# Patient Record
Sex: Male | Born: 1980 | Race: Black or African American | Hispanic: No | Marital: Single | Smoking: Current some day smoker
Health system: Southern US, Community
[De-identification: ages and names within clinical notes are randomized; demographics above are authoritative.]

---

## 2002-12-03 ENCOUNTER — Emergency Department (HOSPITAL_COMMUNITY): Admission: EM | Admit: 2002-12-03 | Discharge: 2002-12-03 | Payer: Self-pay | Admitting: Emergency Medicine

## 2005-05-07 ENCOUNTER — Emergency Department (HOSPITAL_COMMUNITY): Admission: EM | Admit: 2005-05-07 | Discharge: 2005-05-08 | Payer: Self-pay | Admitting: Emergency Medicine

## 2005-10-15 ENCOUNTER — Emergency Department (HOSPITAL_COMMUNITY): Admission: EM | Admit: 2005-10-15 | Discharge: 2005-10-15 | Payer: Self-pay | Admitting: Emergency Medicine

## 2008-01-10 ENCOUNTER — Ambulatory Visit: Payer: Self-pay | Admitting: Family Medicine

## 2008-03-14 ENCOUNTER — Ambulatory Visit: Payer: Self-pay | Admitting: Family Medicine

## 2008-08-03 ENCOUNTER — Ambulatory Visit: Payer: Self-pay | Admitting: Family Medicine

## 2009-05-11 ENCOUNTER — Ambulatory Visit: Payer: Self-pay | Admitting: Family Medicine

## 2009-05-11 ENCOUNTER — Encounter: Admission: RE | Admit: 2009-05-11 | Discharge: 2009-05-11 | Payer: Self-pay | Admitting: Family Medicine

## 2009-05-18 ENCOUNTER — Ambulatory Visit: Payer: Self-pay | Admitting: Family Medicine

## 2010-02-17 ENCOUNTER — Emergency Department (HOSPITAL_COMMUNITY)
Admission: EM | Admit: 2010-02-17 | Discharge: 2010-02-17 | Payer: Self-pay | Source: Home / Self Care | Admitting: Emergency Medicine

## 2010-10-04 ENCOUNTER — Encounter: Payer: Self-pay | Admitting: Family Medicine

## 2010-11-12 IMAGING — CR DG WRIST COMPLETE 3+V*R*
1 series · 1 of 1 positions shown · non-contrast
Comparison: None

CLINICAL DATA: Wrist pain status post fall approximately 2 weeks
ago.

RIGHT WRIST - COMPLETE 3+ VIEW

[view not recorded]
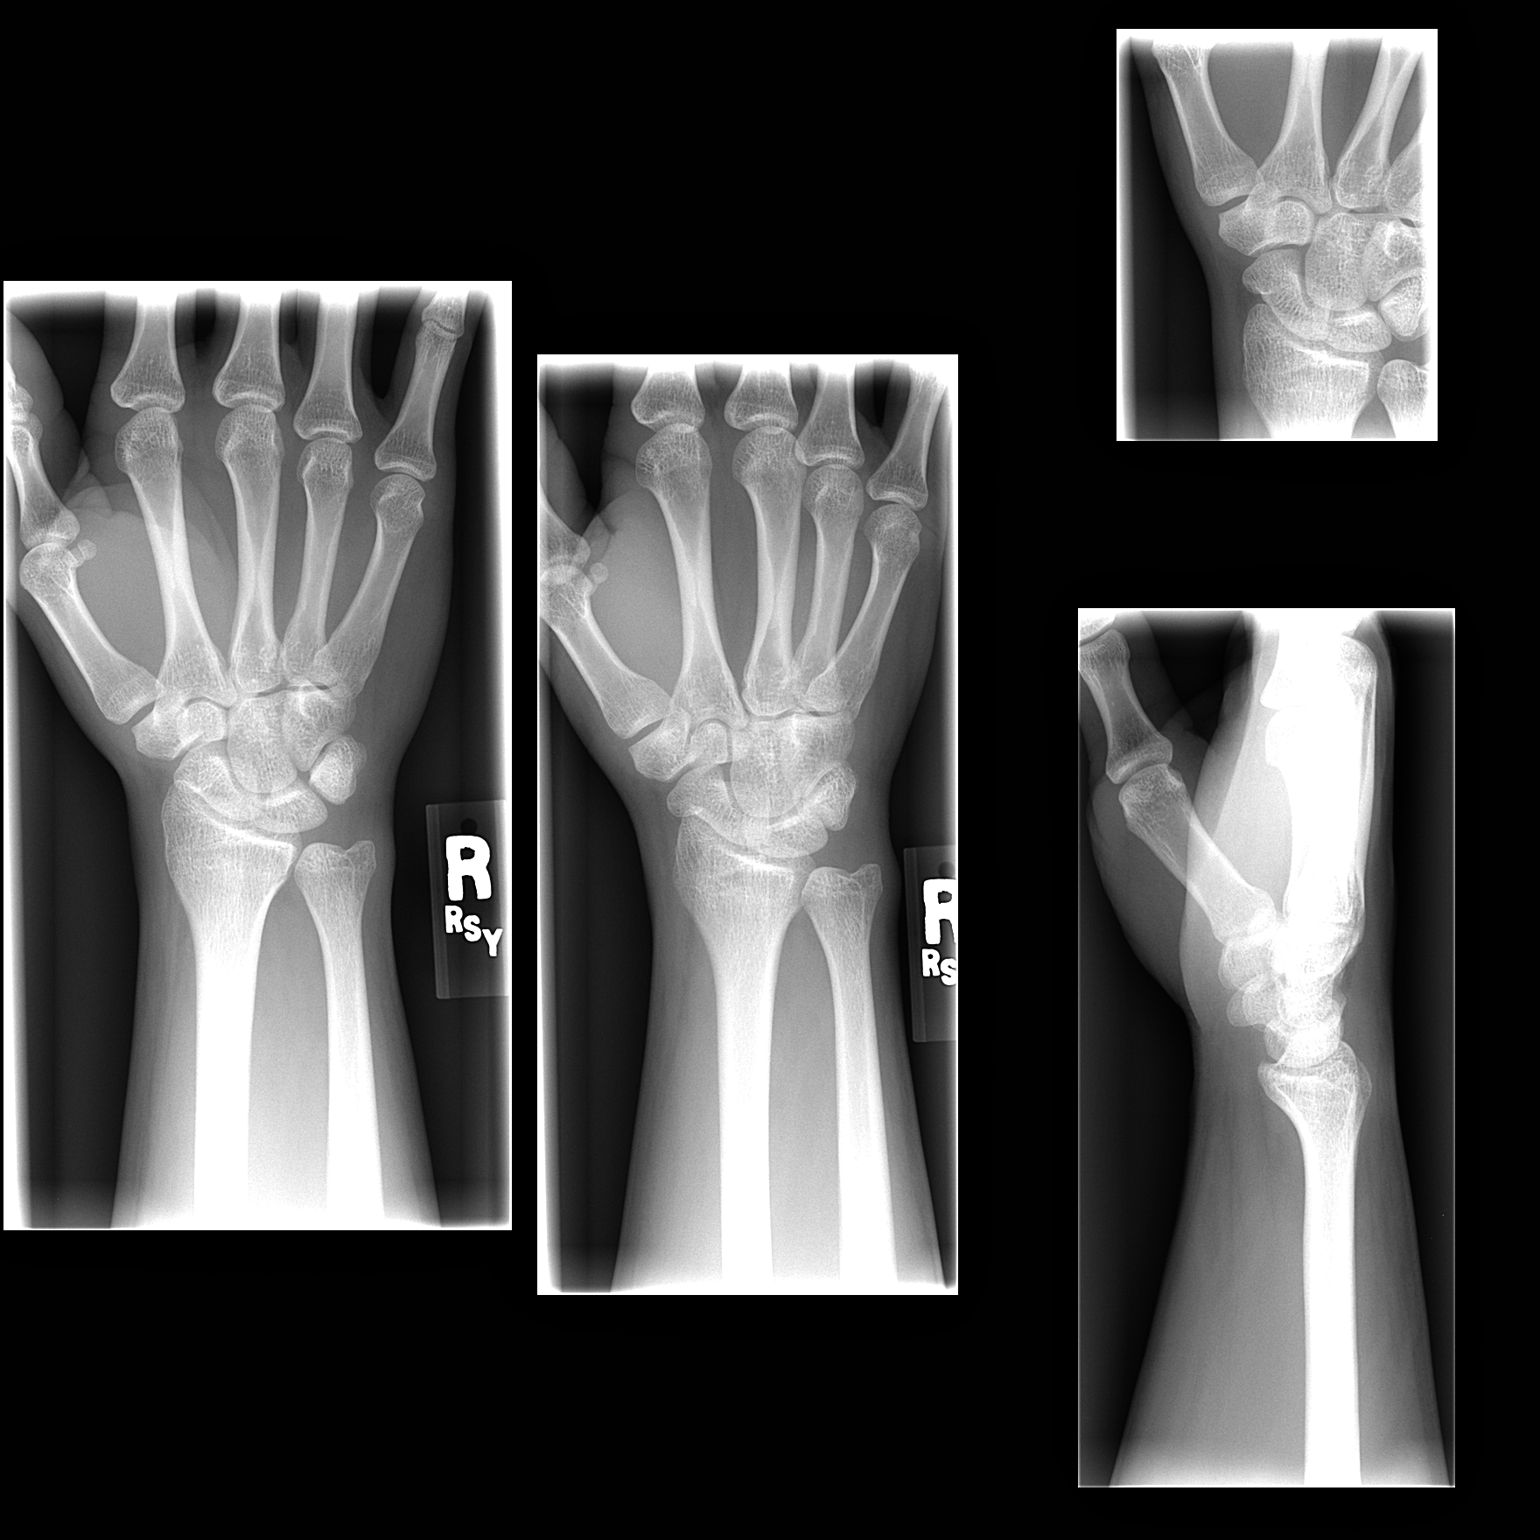

[1 of 1 positions shown; findings below may reference images not displayed]

FINDINGS: Mineralization and alignment are normal.  There is no
evidence of acute fracture or dislocation.  The joint spaces appear
preserved.
IMPRESSION: No acute osseous findings.
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
## 2011-12-30 ENCOUNTER — Other Ambulatory Visit: Payer: Self-pay

## 2011-12-30 ENCOUNTER — Encounter: Payer: Self-pay | Admitting: Family Medicine

## 2011-12-30 ENCOUNTER — Ambulatory Visit (INDEPENDENT_AMBULATORY_CARE_PROVIDER_SITE_OTHER): Payer: BC Managed Care – PPO | Admitting: Family Medicine

## 2011-12-30 VITALS — BP 140/90 | HR 78 | Wt 136.0 lb

## 2011-12-30 DIAGNOSIS — S6000XA Contusion of unspecified finger without damage to nail, initial encounter: Secondary | ICD-10-CM

## 2011-12-30 DIAGNOSIS — S60111A Contusion of right thumb with damage to nail, initial encounter: Secondary | ICD-10-CM

## 2011-12-30 DIAGNOSIS — M79644 Pain in right finger(s): Secondary | ICD-10-CM

## 2011-12-30 DIAGNOSIS — M79609 Pain in unspecified limb: Secondary | ICD-10-CM
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
## 2012-04-25 ENCOUNTER — Emergency Department (HOSPITAL_COMMUNITY)
Admission: EM | Admit: 2012-04-25 | Discharge: 2012-04-25 | Disposition: A | Discharge status: Home / Self Care | Payer: BC Managed Care – PPO | Attending: Emergency Medicine | Admitting: Emergency Medicine

## 2012-04-25 ENCOUNTER — Encounter (HOSPITAL_COMMUNITY): Payer: Self-pay

## 2012-04-25 ENCOUNTER — Emergency Department (HOSPITAL_COMMUNITY): Payer: BC Managed Care – PPO

## 2012-04-25 DIAGNOSIS — S4980XA Other specified injuries of shoulder and upper arm, unspecified arm, initial encounter: Secondary | ICD-10-CM | POA: Insufficient documentation

## 2012-04-25 DIAGNOSIS — IMO0002 Reserved for concepts with insufficient information to code with codable children: Secondary | ICD-10-CM | POA: Insufficient documentation

## 2012-04-25 DIAGNOSIS — S060XAA Concussion with loss of consciousness status unknown, initial encounter: Secondary | ICD-10-CM | POA: Insufficient documentation

## 2012-04-25 DIAGNOSIS — Y92410 Unspecified street and highway as the place of occurrence of the external cause: Secondary | ICD-10-CM | POA: Insufficient documentation

## 2012-04-25 DIAGNOSIS — S46909A Unspecified injury of unspecified muscle, fascia and tendon at shoulder and upper arm level, unspecified arm, initial encounter: Secondary | ICD-10-CM | POA: Insufficient documentation

## 2012-04-25 DIAGNOSIS — S4990XA Unspecified injury of shoulder and upper arm, unspecified arm, initial encounter: Secondary | ICD-10-CM

## 2012-04-25 DIAGNOSIS — Y9389 Activity, other specified: Secondary | ICD-10-CM | POA: Insufficient documentation

## 2012-04-25 DIAGNOSIS — S060X9A Concussion with loss of consciousness of unspecified duration, initial encounter: Secondary | ICD-10-CM

## 2012-04-25 DIAGNOSIS — F17200 Nicotine dependence, unspecified, uncomplicated: Secondary | ICD-10-CM | POA: Insufficient documentation

## 2012-04-25 DIAGNOSIS — S0993XA Unspecified injury of face, initial encounter: Secondary | ICD-10-CM | POA: Insufficient documentation

## 2012-04-25 MED ORDER — IBUPROFEN 800 MG PO TABS: 800.0000 mg | ORAL_TABLET | Freq: Three times a day (TID) | ORAL | Status: DC

## 2012-04-25 MED ORDER — DIAZEPAM 5 MG PO TABS
5.0000 mg | ORAL_TABLET | Freq: Once | ORAL | Status: AC
  Administered 2012-04-25: 5 mg via ORAL
  Filled 2012-04-25: qty 1

## 2012-04-25 MED ORDER — IBUPROFEN 800 MG PO TABS
800.0000 mg | ORAL_TABLET | Freq: Once | ORAL | Status: AC
  Administered 2012-04-25: 800 mg via ORAL
  Filled 2012-04-25: qty 1

## 2012-04-25 MED ORDER — METOCLOPRAMIDE HCL 10 MG PO TABS
10.0000 mg | ORAL_TABLET | Freq: Once | ORAL | Status: AC
  Administered 2012-04-25: 10 mg via ORAL
  Filled 2012-04-25: qty 1
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
## 2012-04-28 ENCOUNTER — Ambulatory Visit (INDEPENDENT_AMBULATORY_CARE_PROVIDER_SITE_OTHER): Payer: BC Managed Care – PPO | Admitting: Medical

## 2012-04-28 ENCOUNTER — Encounter: Payer: Self-pay | Admitting: Medical

## 2012-04-28 VITALS — BP 130/88 | HR 78 | Temp 98.0°F | Resp 16 | Wt 137.0 lb

## 2012-04-28 DIAGNOSIS — M549 Dorsalgia, unspecified: Secondary | ICD-10-CM

## 2012-04-28 DIAGNOSIS — M25511 Pain in right shoulder: Secondary | ICD-10-CM

## 2012-04-28 DIAGNOSIS — M79601 Pain in right arm: Secondary | ICD-10-CM

## 2012-04-28 DIAGNOSIS — S060X9A Concussion with loss of consciousness of unspecified duration, initial encounter: Secondary | ICD-10-CM

## 2012-04-28 DIAGNOSIS — R209 Unspecified disturbances of skin sensation: Secondary | ICD-10-CM

## 2012-04-28 DIAGNOSIS — M542 Cervicalgia: Secondary | ICD-10-CM

## 2012-04-28 DIAGNOSIS — M25519 Pain in unspecified shoulder: Secondary | ICD-10-CM

## 2012-04-28 DIAGNOSIS — M79609 Pain in unspecified limb: Secondary | ICD-10-CM

## 2012-04-28 MED ORDER — CYCLOBENZAPRINE HCL 10 MG PO TABS: ORAL_TABLET | ORAL | Status: DC

## 2012-04-28 NOTE — Patient Instructions (Signed)
Your current symptoms suggest generalized back , right shoulder and neck pain/strain.  The concussion symptoms seem to have improved significantly.  Use heat to the neck and back.  Continue Ibuprofen 800mg  3 times daily for at least the next 5 days or so.  I am prescribing a muscle relaxer today called flexeril.  Use can use 1/2 - 1 tablet either at bedtime or up to twice daily as needed.  Begin chiropractic care with [facility] today as planned.    If not significantly improved in 1 week, then return.
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
## 2012-05-21 ENCOUNTER — Ambulatory Visit: Payer: BC Managed Care – PPO | Admitting: Medical

## 2012-06-08 ENCOUNTER — Ambulatory Visit (INDEPENDENT_AMBULATORY_CARE_PROVIDER_SITE_OTHER): Payer: BC Managed Care – PPO | Admitting: Medical

## 2012-06-08 ENCOUNTER — Encounter: Payer: Self-pay | Admitting: Medical

## 2012-06-08 VITALS — BP 128/82 | HR 70 | Resp 14 | Ht 63.0 in | Wt 140.0 lb

## 2012-06-08 DIAGNOSIS — R29898 Other symptoms and signs involving the musculoskeletal system: Secondary | ICD-10-CM

## 2012-06-08 DIAGNOSIS — M542 Cervicalgia: Secondary | ICD-10-CM

## 2012-06-08 DIAGNOSIS — M25611 Stiffness of right shoulder, not elsewhere classified: Secondary | ICD-10-CM

## 2012-06-08 DIAGNOSIS — M549 Dorsalgia, unspecified: Secondary | ICD-10-CM

## 2012-06-08 DIAGNOSIS — M62830 Muscle spasm of back: Secondary | ICD-10-CM

## 2012-06-08 DIAGNOSIS — M5380 Other specified dorsopathies, site unspecified: Secondary | ICD-10-CM

## 2012-06-08 DIAGNOSIS — R209 Unspecified disturbances of skin sensation: Secondary | ICD-10-CM

## 2012-06-08 DIAGNOSIS — M25519 Pain in unspecified shoulder: Secondary | ICD-10-CM

## 2012-06-08 DIAGNOSIS — R202 Paresthesia of skin: Secondary | ICD-10-CM

## 2012-06-08 MED ORDER — CYCLOBENZAPRINE HCL 10 MG PO TABS: ORAL_TABLET | ORAL | Status: AC

## 2012-06-08 MED ORDER — IBUPROFEN 800 MG PO TABS: 800.0000 mg | ORAL_TABLET | Freq: Three times a day (TID) | ORAL | Status: DC | PRN
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
## 2012-06-23 ENCOUNTER — Telehealth: Payer: Self-pay | Admitting: Internal Medicine

## 2012-06-23 NOTE — Telephone Encounter (Signed)
Medical records were faxed to Mackey Birchwood @ [facility] [place] [phone]

## 2012-09-29 DIAGNOSIS — Z0289 Encounter for other administrative examinations: Secondary | ICD-10-CM

## 2013-01-15 ENCOUNTER — Telehealth: Payer: Self-pay | Admitting: Internal Medicine

## 2013-01-15 NOTE — Telephone Encounter (Signed)
There was no records found that [employer]key Birchwood attorney at law was requesting for. Faxed back and let them know that.

## 2013-03-23 ENCOUNTER — Telehealth: Payer: Self-pay | Admitting: Internal Medicine

## 2013-03-23 NOTE — Telephone Encounter (Signed)
Faxed over medical records to Mackey Birchwood attorney at law @ [phone] from date 04/25/12 to present
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
## 2014-01-09 ENCOUNTER — Ambulatory Visit (INDEPENDENT_AMBULATORY_CARE_PROVIDER_SITE_OTHER): Payer: BC Managed Care – PPO | Admitting: Medical

## 2014-01-09 ENCOUNTER — Encounter: Payer: Self-pay | Admitting: Medical

## 2014-01-09 VITALS — BP 120/80 | HR 92 | Temp 97.9°F | Resp 16 | Wt 143.0 lb

## 2014-01-09 DIAGNOSIS — M5489 Other dorsalgia: Secondary | ICD-10-CM

## 2014-01-09 DIAGNOSIS — M549 Dorsalgia, unspecified: Secondary | ICD-10-CM

## 2014-01-09 DIAGNOSIS — M533 Sacrococcygeal disorders, not elsewhere classified: Secondary | ICD-10-CM

## 2014-01-09 DIAGNOSIS — M542 Cervicalgia: Secondary | ICD-10-CM

## 2014-01-09 MED ORDER — IBUPROFEN 800 MG PO TABS: 800.0000 mg | ORAL_TABLET | Freq: Three times a day (TID) | ORAL | Status: AC | PRN
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
## 2014-10-09 DIAGNOSIS — Z0279 Encounter for issue of other medical certificate: Secondary | ICD-10-CM

## 2017-07-31 ENCOUNTER — Encounter: Payer: Self-pay | Admitting: Family Medicine

## 2018-03-03 ENCOUNTER — Emergency Department (HOSPITAL_COMMUNITY): Payer: BLUE CROSS/BLUE SHIELD

## 2018-03-03 ENCOUNTER — Emergency Department (HOSPITAL_COMMUNITY)
Admission: EM | Admit: 2018-03-03 | Discharge: 2018-03-03 | Disposition: A | Discharge status: Home / Self Care | Payer: BLUE CROSS/BLUE SHIELD | Attending: Emergency Medicine | Admitting: Emergency Medicine

## 2018-03-03 ENCOUNTER — Encounter (HOSPITAL_COMMUNITY): Payer: Self-pay

## 2018-03-03 DIAGNOSIS — Y9389 Activity, other specified: Secondary | ICD-10-CM | POA: Diagnosis not present

## 2018-03-03 DIAGNOSIS — F17210 Nicotine dependence, cigarettes, uncomplicated: Secondary | ICD-10-CM | POA: Insufficient documentation

## 2018-03-03 DIAGNOSIS — M545 Low back pain, unspecified: Secondary | ICD-10-CM

## 2018-03-03 DIAGNOSIS — Y92410 Unspecified street and highway as the place of occurrence of the external cause: Secondary | ICD-10-CM | POA: Diagnosis not present

## 2018-03-03 DIAGNOSIS — Y999 Unspecified external cause status: Secondary | ICD-10-CM | POA: Insufficient documentation

## 2018-03-03 MED ORDER — METHOCARBAMOL 500 MG PO TABS: 500.0000 mg | ORAL_TABLET | Freq: Two times a day (BID) | ORAL | 0 refills | Status: AC

## 2018-03-03 MED ORDER — IBUPROFEN 800 MG PO TABS
800.0000 mg | ORAL_TABLET | Freq: Once | ORAL | Status: AC
  Administered 2018-03-03: 800 mg via ORAL
  Filled 2018-03-03: qty 1
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
## 2018-03-03 NOTE — Discharge Instructions (Signed)
Take Robaxin as needed for muscle spasms.  Ice affected area. Return to the ED immediately for any worsening symptoms or concerns, such as difficulty urinating, numbness, tingling, difficulty walking or any concerns at all.
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
## 2022-04-16 ENCOUNTER — Encounter: Payer: Self-pay | Admitting: Family Medicine

## 2022-04-16 ENCOUNTER — Ambulatory Visit (INDEPENDENT_AMBULATORY_CARE_PROVIDER_SITE_OTHER): Payer: BC Managed Care – PPO | Admitting: Family Medicine

## 2022-04-16 VITALS — BP 158/90 | HR 110 | Temp 97.5°F | Ht 63.0 in | Wt 151.0 lb

## 2022-04-16 DIAGNOSIS — H1033 Unspecified acute conjunctivitis, bilateral: Secondary | ICD-10-CM

## 2022-04-16 MED ORDER — CIPROFLOXACIN HCL 0.3 % OP SOLN: OPHTHALMIC | 0 refills | Status: AC

## 2022-04-16 NOTE — Assessment & Plan Note (Signed)
Symptoms consistent with bacterial conjunctivitis, pressure in setting of contact wearing.  Overall pupillary function is normal, nonpainful, low concern for glaucoma at this time Advised to avoid wearing contacts till resolution of symptoms Trial ciprofloxacin drops Return precautions discussed Low threshold to refer to ophthalmology

## 2022-04-16 NOTE — Patient Instructions (Signed)
Use ciprofloxacin drops, go to ED if severe pain or blurry vision

## 2022-04-16 NOTE — Progress Notes (Signed)
Assessment/Plan:   Problem List Items Addressed This Visit       Other   Acute bacterial conjunctivitis of both eyes - Primary    Symptoms consistent with bacterial conjunctivitis, pressure in setting of contact wearing.  Overall pupillary function is normal, nonpainful, low concern for glaucoma at this time Advised to avoid wearing contacts till resolution of symptoms Trial ciprofloxacin drops Return precautions discussed Low threshold to refer to ophthalmology      Relevant Medications   ciprofloxacin (CILOXAN) 0.3 % ophthalmic solution       Subjective:  HPI:  Alan Wright is a 41 y.o. male who has Acute bacterial conjunctivitis of both eyes on their problem list..   He  has no past medical history on file.Marland Kitchen   He presents with chief complaint of Establish Care (Eye redness , watery , crust and sensitivity x 3 days) .    Conjunctivitis.  Patient presents with 3 days of red eyes.  Patient last wears contacts on Sunday the following day he developed red eyes.  They are not painful but to feel irritated and gritty.  He also reports having significant purulent discharge and crusting.  Patient denies any vision loss or change.  He has no headache, otalgia fevers, runny nose, cough, chest pain, shortness of breath.  He has not had any contact with anyone else  History reviewed. No pertinent surgical history.  Outpatient Medications Prior to Visit  Medication Sig Dispense Refill   cyclobenzaprine (FLEXERIL) 10 MG tablet QHS spasm (Patient not taking: Reported on 04/16/2022) 30 tablet 0   ibuprofen (ADVIL,MOTRIN) 800 MG tablet Take 1 tablet (800 mg total) by mouth every 8 (eight) hours as needed. (Patient not taking: Reported on 04/16/2022) 30 tablet 0   No facility-administered medications prior to visit.    Family History  Problem Relation Age of Onset   Diabetes Father    Hypertension Father    Heart disease Father     Social History   Socioeconomic  History   Marital status: Single    Spouse name: Not on file   Number of children: Not on file   Years of education: Not on file   Highest education level: Not on file  Occupational History   Not on file  Tobacco Use   Smoking status: Some Days    Packs/day: 0.25    Types: Cigarettes    Passive exposure: Never   Smokeless tobacco: Never  Vaping Use   Vaping Use: Never used  Substance and Sexual Activity   Alcohol use: Yes   Drug use: No   Sexual activity: Not on file  Other Topics Concern   Not on file  Social History Narrative   Not on file   Social Determinants of Health   Financial Resource Strain: Not on file  Food Insecurity: Not on file  Transportation Needs: Not on file  Physical Activity: Not on file  Stress: Not on file  Social Connections: Not on file  Intimate Partner Violence: Not on file  Objective:  Physical Exam: BP (!) 158/90 (BP Location: Left Arm, Patient Position: Sitting, Cuff Size: Large)   Pulse (!) 110   Temp (!) 97.5 F (36.4 C) (Temporal)   Ht 5\' 3"  (1.6 m)   Wt 151 lb (68.5 kg)   SpO2 97%   BMI 26.75 kg/m    General: No acute distress. Awake and conversant.  Eyes: Bilateral injected sclera, no evidence of conjunctival, corneal, eyelid injury, EOMI, PERRLA ENT: Hearing grossly intact. No nasal discharge.  Neck: Neck is supple. No masses or thyromegaly.  Respiratory: Respirations are non-labored.  CTA B Skin: Warm. No rashes or ulcers.  Psych: Alert and oriented. Cooperative, Appropriate mood and affect, Normal judgment.  CV: No cyanosis or JVD, RRR, no MRG ABD: Nontender nondistended MSK: Normal ambulation. No clubbing  Neuro: Sensation and CN II-XII grossly normal.        , MD, MS

## 2022-04-18 ENCOUNTER — Telehealth: Payer: Self-pay | Admitting: Family Medicine

## 2022-04-18 NOTE — Telephone Encounter (Signed)
Left patient a detailed voice message to return call to office.   

## 2022-04-18 NOTE — Telephone Encounter (Signed)
Caller Name: Thayer Ohm Call back phone #: [phone]   MEDICATION(S):   ciprofloxacin (CILOXAN) 0.3 % ophthalmic solution  Pt has been using every 2 hours for 2 days  Eyes are still red and draining - does not seem to be helping. Is there an alternative medication  Preferred Pharmacy:  [id number] [place], [place] - [address] Phone: [phone]  Fax: [phone]

## 2022-04-18 NOTE — Telephone Encounter (Signed)
Advised patient of annotation below and he inquired about Erythromycin Eye Ointment and if that would be an option as well? Please advise.

## 2022-04-18 NOTE — Telephone Encounter (Signed)
Advised patient of annotation below and he verbalized understanding. Patient is at eye doctor currently.

## 2022-04-18 NOTE — Telephone Encounter (Signed)
Please advise in Dr. Thompson's absence.
